# Patient Record
Sex: Male | Born: 1995 | Race: White | Hispanic: No | Marital: Single | Smoking: Current some day smoker
Health system: Southern US, Community
[De-identification: ages and names within clinical notes are randomized; demographics above are authoritative.]

---

## 2018-08-28 ENCOUNTER — Ambulatory Visit
Admission: RE | Admit: 2018-08-28 | Discharge: 2018-08-28 | Disposition: A | Discharge status: Home / Self Care | Payer: BC Managed Care – PPO | Source: Ambulatory Visit | Attending: Sports Medicine | Admitting: Sports Medicine

## 2018-08-28 ENCOUNTER — Ambulatory Visit (INDEPENDENT_AMBULATORY_CARE_PROVIDER_SITE_OTHER): Payer: BC Managed Care – PPO | Admitting: Sports Medicine

## 2018-08-28 VITALS — BP 110/76 | Ht 67.0 in | Wt 185.0 lb

## 2018-08-28 DIAGNOSIS — M25511 Pain in right shoulder: Secondary | ICD-10-CM | POA: Diagnosis not present

## 2018-08-28 NOTE — Progress Notes (Signed)
   Subjective:    Patient ID: Jerry Alvarado, male    DOB: October 29, 1995, 22 y.o.   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  Pain began acutely during baseball practice.  He had a sudden onset of sharp pain that he localizes to the posterior lateral shoulder.  He has been resting and doing rehabilitation in the training room for the past 2 months without any improvement.  Pain is present primarily with throwing.  No pain with batting.  He does endorse pain with abduction and external rotation.  He has noticed some weakness as well.  He injured this same shoulder in 2018.  Was diagnosed with rotator cuff impingement and had complete resolution of symptoms at that time with physical therapy.  This is a new injury for him.  He denies numbness or tingling.  No prior shoulder surgeries.  No neck pain.  Past medical history reviewed Medications reviewed Allergies reviewed  Review of Systems    As above Objective:   Physical Exam  Well-developed, well-nourished.  No acute distress.  Awake alert and oriented x3.  Vital signs reviewed  Right shoulder: Full active range of motion.  Passive internal rotation is limited to about 70 degrees.  No tenderness to palpation.  No atrophy.  4+/5 strength with resisted supraspinatus.  5/5 strength with resisted external rotation and internal rotation.  Positive O'Brien's.  Negative clunk.  Negative apprehension.  Negative Spurling's.  Neurovascularly intact distally.  X-rays of the right shoulder are unremarkable      Assessment & Plan:   Right shoulder pain-rule out rotator cuff tear versus labral tear  Patient has persistent symptoms despite two months of treatment in the training room [facility] [employer].  I am going to get an MRI arthrogram of the right shoulder specifically to rule out a rotator cuff tear  versus a labral tear.  We will delineate treatment based on those findings.  I will call him with those results when available.

## 2018-08-29 ENCOUNTER — Encounter: Payer: Self-pay | Admitting: Sports Medicine

## 2018-09-05 ENCOUNTER — Ambulatory Visit
Admission: RE | Admit: 2018-09-05 | Discharge: 2018-09-05 | Disposition: A | Discharge status: Home / Self Care | Payer: BC Managed Care – PPO | Source: Ambulatory Visit | Attending: Sports Medicine | Admitting: Sports Medicine

## 2018-09-05 DIAGNOSIS — M25511 Pain in right shoulder: Secondary | ICD-10-CM

## 2018-09-05 MED ORDER — IOPAMIDOL (ISOVUE-M 200) INJECTION 41%
18.0000 mL | Freq: Once | INTRAMUSCULAR | Status: AC
  Administered 2018-09-05: 18 mL via INTRA_ARTICULAR

## 2018-09-09 ENCOUNTER — Telehealth: Payer: Self-pay | Admitting: Sports Medicine

## 2018-09-09 DIAGNOSIS — M25511 Pain in right shoulder: Secondary | ICD-10-CM

## 2018-09-09 NOTE — Telephone Encounter (Signed)
  I spoke with Jerry Alvarado on the phone today after reviewing his MRI arthrogram of his right shoulder.  He has a small nondisplaced tear of the posterior inferior labrum.  I recommended consultation with Dr. Thurston HoleWainer to discuss treatment going forward.  I think his tear is small enough that it can be treated conservatively but the patient may be interested in trying PRP.  I will defer further work-up and treatment to the discretion of Dr. Thurston HoleWainer and the patient will follow-up with me as needed.

## 2018-09-09 NOTE — Addendum Note (Signed)
Addended by: Rutha BouchardBABNIK, Emory Leaver E on: 09/09/2018 02:40 PM   Modules accepted: Orders

## 2018-09-18 ENCOUNTER — Encounter: Payer: Self-pay | Admitting: Allergy

## 2018-09-18 ENCOUNTER — Ambulatory Visit: Payer: BC Managed Care – PPO | Admitting: Allergy

## 2018-09-18 VITALS — BP 108/82 | HR 64 | Temp 97.7°F | Resp 14 | Ht 67.0 in | Wt 189.4 lb

## 2018-09-18 DIAGNOSIS — T7807XD Anaphylactic reaction due to milk and dairy products, subsequent encounter: Secondary | ICD-10-CM

## 2018-09-18 DIAGNOSIS — T7840XD Allergy, unspecified, subsequent encounter: Secondary | ICD-10-CM

## 2018-09-18 NOTE — Progress Notes (Signed)
New Patient Note  RE: Jerry Alvarado MRN: [id number] DOB: Oct 27, 1995 Date of Office Visit: 09/18/2018  Referring provider: Juluis Rainier, MD Primary care provider: Patient, No Pcp Per  Chief Complaint: allergic reaction  History of present illness: Jerry Alvarado is a 22 y.o. male presenting today for consultation for allergic reaction.    He states he had an allergic reaction on [employer] [facility] and he went to the nearby [facility] and saw Dr. Zachery Dauer who recommended an allergy referral.  He drank a frappacino with soy milk (which is something he does not usually drink) and about 15 minutes later reports his lips, eyes and fingers were swelling, throat itchy and he had developed difficulty breathing. This occurred about 1.5 month ago.   He states he had just come from class.  He did take Benadryl prior to going to the urgent care.  He received a "shot" at [facility].  From the urgent care records it is unclear what medication he received in the office.he was discharged to continue a 6-day prednisone taper as well as provided with an EpiPen prescription which she has filled.  He has used the trainer and states he is comfortable with use if he needs to.   He states he has PBJ breakfast everyday and does not recall any other foods he may have eaten but states it is possible he may have had some red meat products.  He states he has had tick bites in the remote past.  He has never had any issues or reactions surrounding red meat ingestion.  He also reports a reaction after eating [employer] bar (around 4-5 years ago) and states he had throat itchiness and cough and symptoms resolved after about 30 minutes.  This reaction he reports was much milder than the recent reaction.  No previous history food allergy, asthma or eczema.   He does report sneezing, cough, nasal congestion primarily during spring. He states he does not take any medications for this as it is not that bothersome for him.     Review of systems: Review of Systems  Constitutional: Negative for chills, fever and malaise/fatigue.  HENT: Negative for congestion, ear discharge, ear pain, nosebleeds and sore throat.   Eyes: Negative for pain, discharge and redness.  Respiratory: Positive for cough and shortness of breath.   Cardiovascular: Negative for chest pain.  Gastrointestinal: Negative for abdominal pain, constipation, diarrhea, heartburn, nausea and vomiting.  Musculoskeletal: Negative for joint pain.  Skin: Negative for itching and rash.  Neurological: Negative for headaches.    All other systems negative unless noted above in HPI  Past medical history: History reviewed. No pertinent past medical history.  Past surgical history: History reviewed. No pertinent surgical history.  Family history:  Family History  Problem Relation Age of Onset  . Cancer Maternal Grandfather   . Heart disease Paternal Grandfather     Social history: He lives in an apartment with carpeting with gas heating and central cooling.  There are no pets in the home but there are cats outside the home.  There is no concern for water damage, mildew or roaches in the home.  He is a Consulting civil engineer at [employer].  He denies a smoking history.  Medication List: Allergies as of 09/18/2018   No Known Allergies     Medication List        Accurate as of 09/18/18  1:42 PM. Always use your most recent med list.  EPINEPHrine 0.3 mg/0.3 mL Soaj injection Commonly known as:  EPI-PEN See admin instructions.       Known medication allergies: No Known Allergies   Physical examination: Blood pressure 108/82, pulse 64, temperature 97.7 F (36.5 C), temperature source Oral, resp. rate 14, height 5\' 7"  (1.702 m), weight 189 lb 6.4 oz (85.9 kg), SpO2 97 %.  General: Alert, interactive, in no acute distress. HEENT: PERRLA< TMs pearly gray, turbinates non-edematous without discharge, post-pharynx non  erythematous. Neck: Supple without lymphadenopathy. Lungs: Clear to auscultation without wheezing, rhonchi or rales. {no increased work of breathing. CV: Normal S1, S2 without murmurs. Abdomen: Nondistended, nontender. Skin: Warm and dry, without lesions or rashes. Extremities:  No clubbing, cyanosis or edema. Neuro:   Grossly intact.  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  Negative to oats, rice, pork, beef.  Histamine control was positive. Patient declined environmental allergy skin prick testing today. Allergy testing results were read and interpreted by provider, documented by clinical staff.   Assessment and plan:   Allergic reaction   - skin testing to select foods is positive to soy.   Negative to oats, rice, beef and pork.    - continue avoidance of soybean products.  Provided with FARE information on soy allergy.   - have access to self-injectable epinephrine Epipen 0.3mg  at all times  - follow emergency action plan in case of allergic reaction  Follow-up 1 year or sooner if needed  I appreciate the opportunity to take part in Doss's care. Please do not hesitate to contact me with questions.  Sincerely,   Margo AyeShaylar Mustafa Potts, MD Allergy/Immunology [facility]

## 2018-09-18 NOTE — Patient Instructions (Addendum)
Allergic reaction   - skin testing to select foods is positive to soy.   Negative to oats, rice, beef and pork.    - continue avoidance of soybean products  - have access to self-injectable epinephrine Epipen 0.3mg  at all times  - follow emergency action plan in case of allergic reaction  Follow-up 1 year or sooner if needed

## 2019-08-20 ENCOUNTER — Ambulatory Visit (INDEPENDENT_AMBULATORY_CARE_PROVIDER_SITE_OTHER): Payer: BC Managed Care – PPO | Admitting: Sports Medicine

## 2019-08-20 ENCOUNTER — Other Ambulatory Visit: Payer: Self-pay

## 2019-08-20 ENCOUNTER — Encounter: Payer: Self-pay | Admitting: Sports Medicine

## 2019-08-20 VITALS — BP 108/74 | Ht 67.0 in | Wt 190.0 lb

## 2019-08-20 DIAGNOSIS — U071 COVID-19: Secondary | ICD-10-CM | POA: Diagnosis not present

## 2019-08-20 NOTE — Progress Notes (Signed)
   Subjective:    Patient ID: Jerry Alvarado, male    DOB: [birth year], 23 y.o.   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  He is now out of quarantine and has returned to class.  He decided to get tested after a friend of his tested positive.  During quarantine he did lose his sensation of taste and smell and also describes chest tightness and fatigue.  He developed a mild cough and chills but no fever.  He is now completely asymptomatic and feels "great".  Otherwise healthy.  Past medical history reviewed Medications reviewed Allergies reviewed   Review of Systems As above    Objective:   Physical Exam  Well-developed, well-nourished.  No acute distress.  Awake alert and oriented x3.  Vital signs reviewed.  HEENT: Normocephalic atraumatic.  Pupils equal round and reactive to light and accommodation Neck: Supple.  No JVD, no lymphadenopathy. Cardiovascular: Regular rate.  No murmurs, rubs, or gallops. Lungs: Clear to auscultation bilaterally.  No rhonchi's, rales, or wheezes. 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  Referral for EKG, echocardiogram, and high-sensitivity troponin were made today.  Clearance for athletics pending those results.  In the meantime, patient is instructed not to engage in any sort of strenuous physical activity.  I will follow-up with him after I review his test results.

## 2019-08-21 ENCOUNTER — Other Ambulatory Visit: Payer: Self-pay | Admitting: *Deleted

## 2019-08-21 DIAGNOSIS — U071 COVID-19: Secondary | ICD-10-CM

## 2019-08-27 ENCOUNTER — Other Ambulatory Visit: Payer: Self-pay

## 2019-08-27 ENCOUNTER — Ambulatory Visit (HOSPITAL_COMMUNITY): Payer: BC Managed Care – PPO | Attending: Cardiology

## 2019-08-27 ENCOUNTER — Ambulatory Visit (INDEPENDENT_AMBULATORY_CARE_PROVIDER_SITE_OTHER): Payer: BC Managed Care – PPO

## 2019-08-27 ENCOUNTER — Other Ambulatory Visit: Payer: BC Managed Care – PPO | Admitting: *Deleted

## 2019-08-27 DIAGNOSIS — U071 COVID-19: Secondary | ICD-10-CM

## 2019-08-27 LAB — TROPONIN I (HIGH SENSITIVITY): Troponin I (High Sensitivity): 4 ng/L (ref <18)

## 2019-08-27 NOTE — Progress Notes (Signed)
1.) Reason for visit: EKG - COVID athlete protocol

## 2019-08-28 ENCOUNTER — Telehealth: Payer: Self-pay | Admitting: Sports Medicine

## 2019-08-28 NOTE — Telephone Encounter (Signed)
  COVID-19 cardiac work-up is unremarkable.  EKG, echocardiogram, and high-sensitivity troponin results are reviewed.  Patient is cleared for athletics without restriction.  Patient will be notified through the head athletic trainer at [employer].

## 2020-07-16 IMAGING — MR MR SHOULDER*R* W/CM
6 series · 40 of 40 positions shown · IV contrast (agent unspecified)
Comparison: Injection images same date.  Radiographs 08/28/2018.

CLINICAL DATA: Shoulder pain with limited range of motion for 2-3
months. Baseball player. No acute injury or prior relevant surgery.

EXAM:
MR ARTHROGRAM OF THE RIGHT SHOULDER
TECHNIQUE: Multiplanar, multisequence MR imaging of the right shoulder was
performed following the administration of intra-articular contrast.
CONTRAST:  See Injection Documentation.

[Series 3: T1 fat-sat · axial · 4.0mm · 0.27mm/px · z∈[-33,+57]mm · 6 of 20 slices shown (1 of 4)]
[im 1/20]
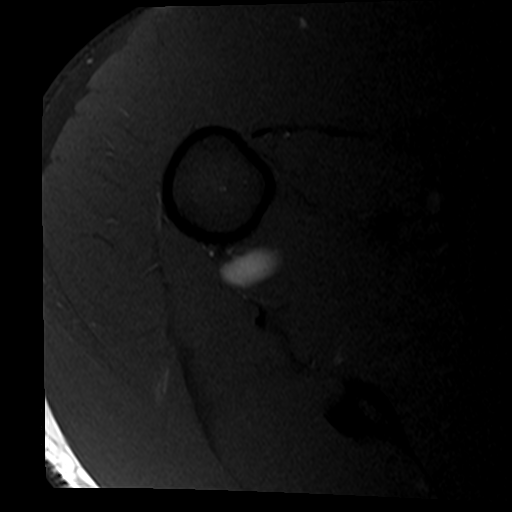
[im 4/20]
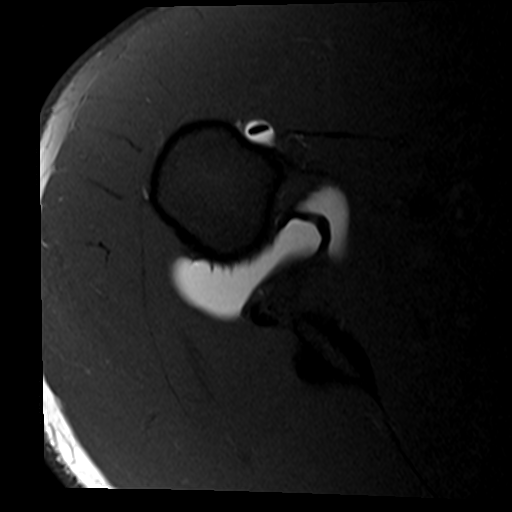
[im 8/20]
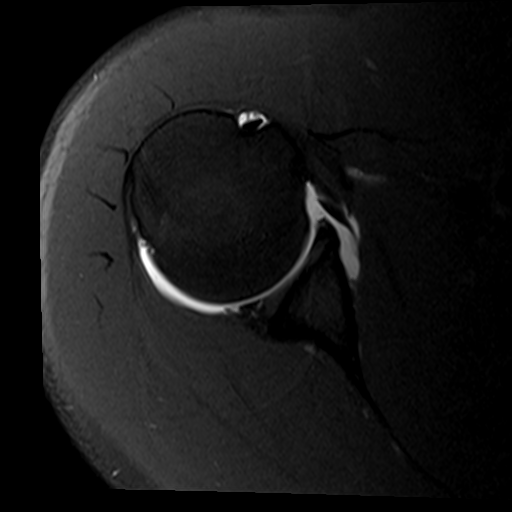
[im 12/20]
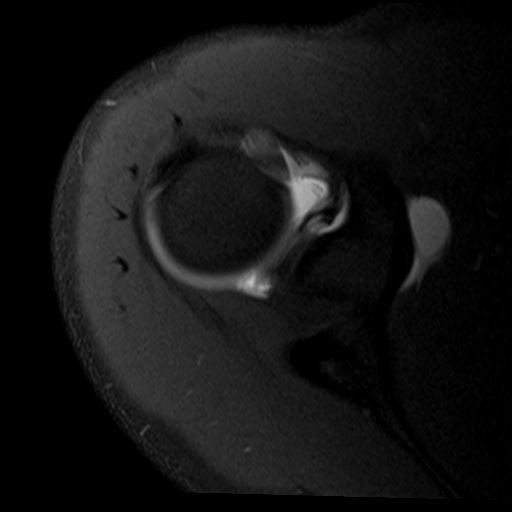
[im 16/20]
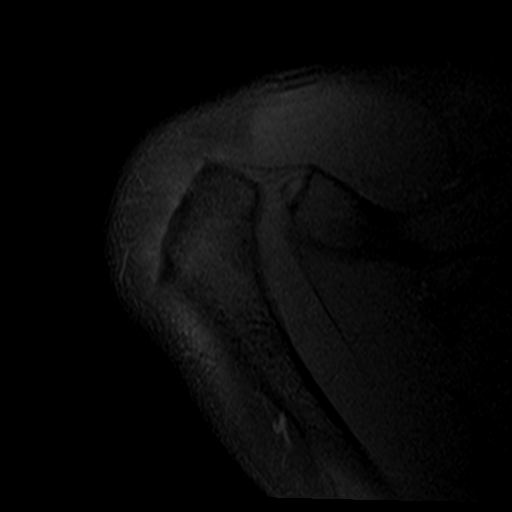
[im 20/20]
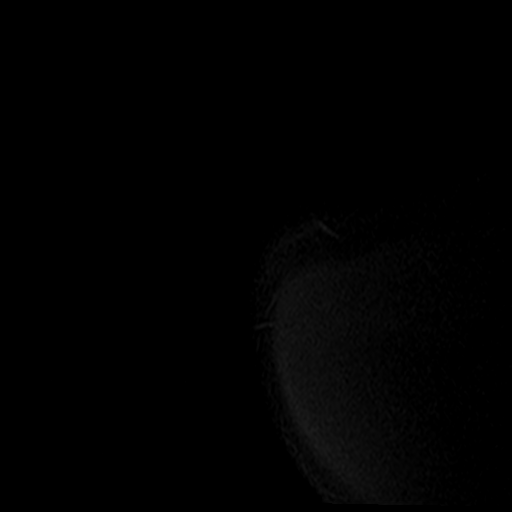

[Series 4: T2 fat-sat · oblique · 4.0mm · 0.55mm/px · 6 of 20 slices shown (1 of 2)]
[im 1/20]
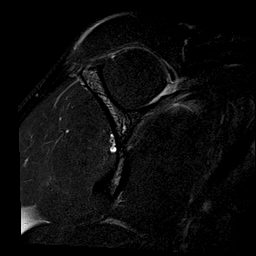
[im 4/20]
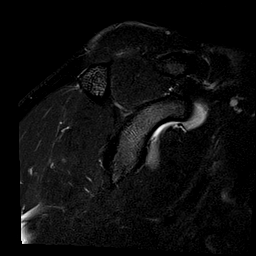
[im 8/20]
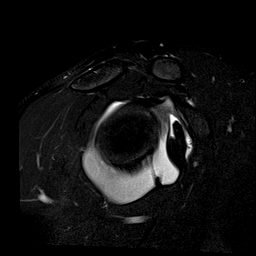
[im 12/20]
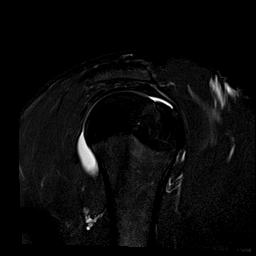
[im 16/20]
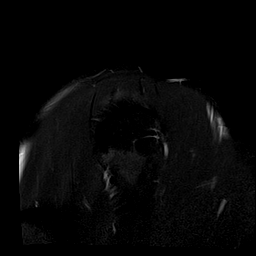
[im 20/20]
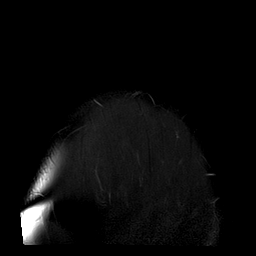

[Series 5: T1 fat-sat · oblique · 4.0mm · 0.55mm/px · 7 of 20 slices shown (2 of 4)]
[im 1/20]
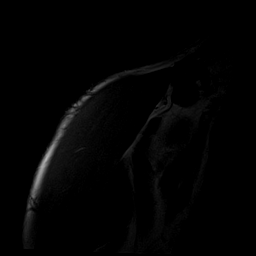
[im 4/20]
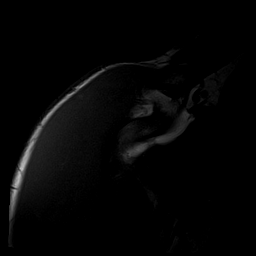
[im 7/20]
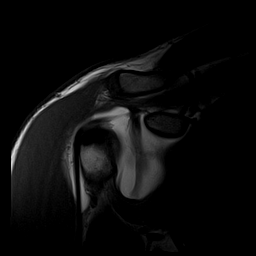
[im 10/20]
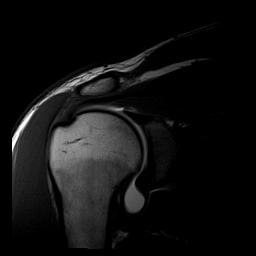
[im 13/20]
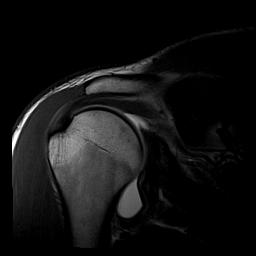
[im 16/20]
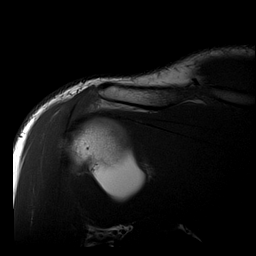
[im 20/20]
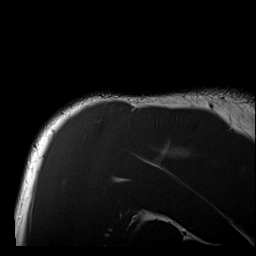

[Series 6: T1 fat-sat · oblique · 4.0mm · 0.55mm/px · 7 of 20 slices shown (3 of 4)]
[im 1/20]
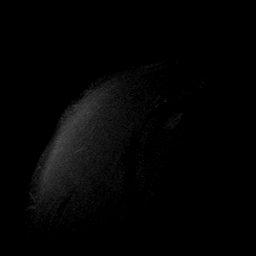
[im 4/20]
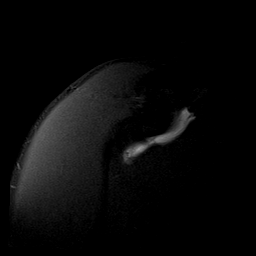
[im 7/20]
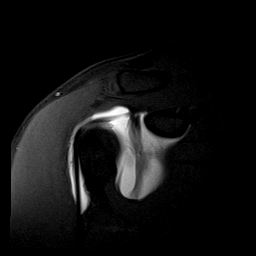
[im 10/20]
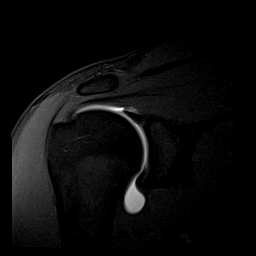
[im 13/20]
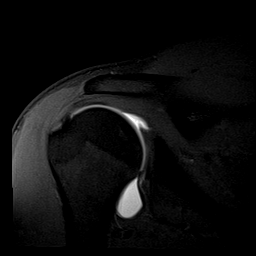
[im 16/20]
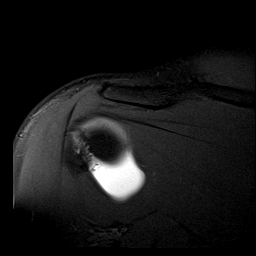
[im 20/20]
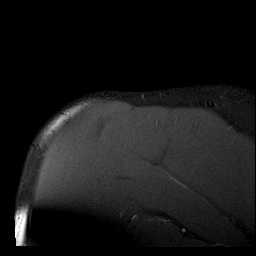

[Series 7: T2 fat-sat · oblique · 4.0mm · 0.55mm/px · 7 of 20 slices shown (2 of 2)]
[im 1/20]
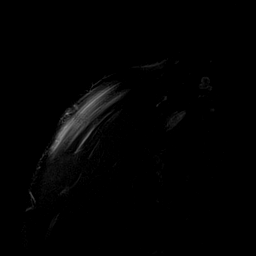
[im 4/20]
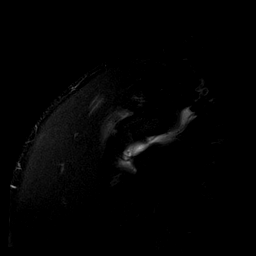
[im 7/20]
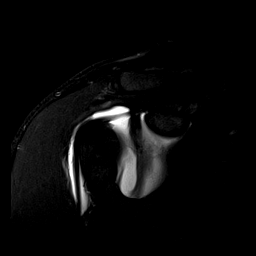
[im 10/20]
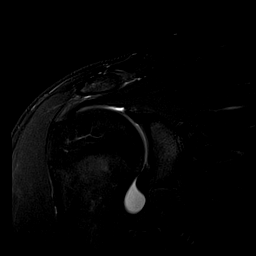
[im 13/20]
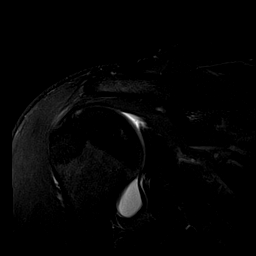
[im 16/20]
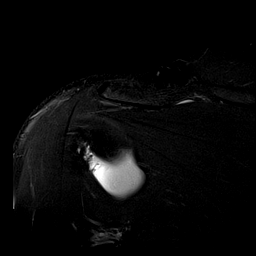
[im 20/20]
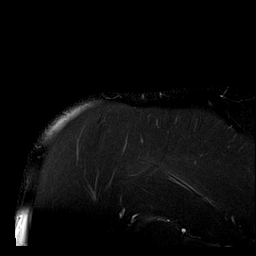

[Series 11: T1 fat-sat · sagittal · 4.0mm · 0.59mm/px · 7 of 22 slices shown (4 of 4)]
[im 1/22]
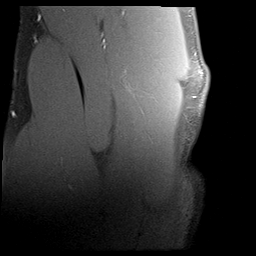
[im 4/22]
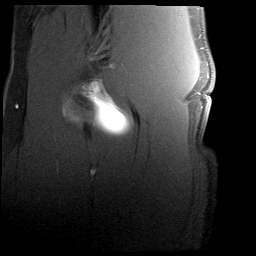
[im 8/22]
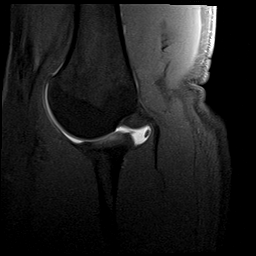
[im 11/22]
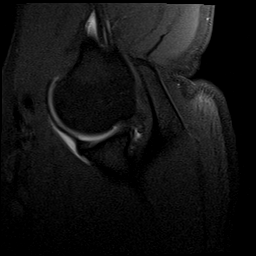
[im 15/22]
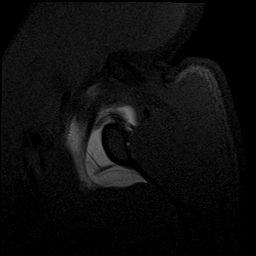
[im 18/22]
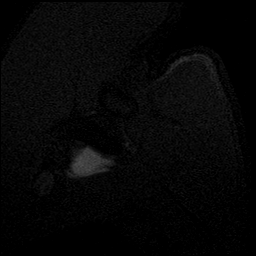
[im 22/22]
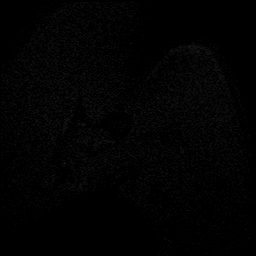

[40 of 40 positions shown; findings below may reference images not displayed]

FINDINGS: Rotator cuff:  Intact without significant tendinosis.

Muscles:  No focal muscular atrophy or edema.

Biceps long head:  Intact and normally positioned.

Acromioclavicular Joint: The acromion is type 2. The
acromioclavicular joint appears normal. There is trace non
contrasted fluid in the subacromial-subdeltoid bursa.

Glenohumeral Joint: The shoulder joint is well distended with
contrast. Questionable tiny filling defect in the axillary recess on
image [DATE] obtained in the ABER position is not seen on the other
views and may reflect air. No significant glenohumeral arthropathy.

Labrum: There is a nondisplaced tear of the posterior inferior
labrum, best seen on axial images 13-16 of series 3. No significant
paralabral cyst formation. The superior labrum is intact.

Bones: No acute or significant extra-articular osseous findings.

Other: No significant soft tissue findings.
IMPRESSION: 1. Small nondisplaced tear of the posterior inferior labrum.
2. The biceps tendon and rotator cuff appear normal.
